# Patient Record
Sex: Male | Born: 1985
Health system: Southern US, Community
[De-identification: ages and names within clinical notes are randomized; demographics above are authoritative.]

## PROBLEM LIST (undated history)

## (undated) DIAGNOSIS — J45909 Unspecified asthma, uncomplicated: Secondary | ICD-10-CM

## (undated) DIAGNOSIS — I10 Essential (primary) hypertension: Secondary | ICD-10-CM

---

## 2017-11-18 ENCOUNTER — Emergency Department (HOSPITAL_COMMUNITY): Payer: Self-pay

## 2017-11-18 ENCOUNTER — Encounter (HOSPITAL_COMMUNITY): Payer: Self-pay | Admitting: Emergency Medicine

## 2017-11-18 ENCOUNTER — Inpatient Hospital Stay (HOSPITAL_COMMUNITY)
Admission: EM | Admit: 2017-11-18 | Discharge: 2017-11-22 | DRG: 871 | Disposition: A | Discharge status: Home / Self Care | Payer: Self-pay | Attending: Family Medicine | Admitting: Family Medicine

## 2017-11-18 DIAGNOSIS — R0902 Hypoxemia: Secondary | ICD-10-CM

## 2017-11-18 DIAGNOSIS — E8809 Other disorders of plasma-protein metabolism, not elsewhere classified: Secondary | ICD-10-CM | POA: Diagnosis present

## 2017-11-18 DIAGNOSIS — I10 Essential (primary) hypertension: Secondary | ICD-10-CM

## 2017-11-18 DIAGNOSIS — R6520 Severe sepsis without septic shock: Secondary | ICD-10-CM

## 2017-11-18 DIAGNOSIS — J129 Viral pneumonia, unspecified: Secondary | ICD-10-CM | POA: Diagnosis present

## 2017-11-18 DIAGNOSIS — J4520 Mild intermittent asthma, uncomplicated: Secondary | ICD-10-CM

## 2017-11-18 DIAGNOSIS — R0600 Dyspnea, unspecified: Secondary | ICD-10-CM

## 2017-11-18 DIAGNOSIS — F17210 Nicotine dependence, cigarettes, uncomplicated: Secondary | ICD-10-CM | POA: Diagnosis present

## 2017-11-18 DIAGNOSIS — R042 Hemoptysis: Secondary | ICD-10-CM | POA: Diagnosis present

## 2017-11-18 DIAGNOSIS — R059 Cough, unspecified: Secondary | ICD-10-CM

## 2017-11-18 DIAGNOSIS — E860 Dehydration: Secondary | ICD-10-CM | POA: Diagnosis present

## 2017-11-18 DIAGNOSIS — N179 Acute kidney failure, unspecified: Secondary | ICD-10-CM | POA: Diagnosis present

## 2017-11-18 DIAGNOSIS — Z23 Encounter for immunization: Secondary | ICD-10-CM

## 2017-11-18 DIAGNOSIS — R05 Cough: Secondary | ICD-10-CM

## 2017-11-18 DIAGNOSIS — J189 Pneumonia, unspecified organism: Secondary | ICD-10-CM | POA: Diagnosis present

## 2017-11-18 DIAGNOSIS — R509 Fever, unspecified: Secondary | ICD-10-CM

## 2017-11-18 DIAGNOSIS — R809 Proteinuria, unspecified: Secondary | ICD-10-CM

## 2017-11-18 DIAGNOSIS — J45909 Unspecified asthma, uncomplicated: Secondary | ICD-10-CM | POA: Diagnosis present

## 2017-11-18 DIAGNOSIS — I161 Hypertensive emergency: Secondary | ICD-10-CM | POA: Diagnosis present

## 2017-11-18 DIAGNOSIS — A419 Sepsis, unspecified organism: Principal | ICD-10-CM | POA: Diagnosis present

## 2017-11-18 HISTORY — DX: Essential (primary) hypertension: I10

## 2017-11-18 HISTORY — DX: Unspecified asthma, uncomplicated: J45.909

## 2017-11-18 LAB — URINALYSIS, MICROSCOPIC (REFLEX)
Bacteria, UA: NONE SEEN
WBC, UA: NONE SEEN WBC/hpf (ref 0–5)

## 2017-11-18 LAB — URINALYSIS, ROUTINE W REFLEX MICROSCOPIC
BILIRUBIN URINE: NEGATIVE
Glucose, UA: NEGATIVE mg/dL
Leukocytes, UA: NEGATIVE
Nitrite: NEGATIVE
Specific Gravity, Urine: 1.015 (ref 1.005–1.030)
pH: 6.5 (ref 5.0–8.0)

## 2017-11-18 LAB — CBC WITH DIFFERENTIAL/PLATELET
Abs Immature Granulocytes: 0.1 10*3/uL — ABNORMAL HIGH (ref 0.00–0.07)
Basophils Absolute: 0.1 10*3/uL (ref 0.0–0.1)
Basophils Relative: 0 %
EOS ABS: 0 10*3/uL (ref 0.0–0.5)
EOS PCT: 0 %
HCT: 48.2 % (ref 39.0–52.0)
Hemoglobin: 15.2 g/dL (ref 13.0–17.0)
Immature Granulocytes: 1 %
Lymphocytes Relative: 6 %
Lymphs Abs: 0.9 10*3/uL (ref 0.7–4.0)
MCH: 26.9 pg (ref 26.0–34.0)
MCHC: 31.5 g/dL (ref 30.0–36.0)
MCV: 85.3 fL (ref 80.0–100.0)
Monocytes Absolute: 1.1 10*3/uL — ABNORMAL HIGH (ref 0.1–1.0)
Monocytes Relative: 7 %
NEUTROS PCT: 86 %
Neutro Abs: 13.4 10*3/uL — ABNORMAL HIGH (ref 1.7–7.7)
PLATELETS: 259 10*3/uL (ref 150–400)
RBC: 5.65 MIL/uL (ref 4.22–5.81)
RDW: 13.2 % (ref 11.5–15.5)
WBC: 15.6 10*3/uL — AB (ref 4.0–10.5)
nRBC: 0 % (ref 0.0–0.2)

## 2017-11-18 LAB — PROTIME-INR
INR: 1.06
PROTHROMBIN TIME: 13.7 s (ref 11.4–15.2)

## 2017-11-18 LAB — COMPREHENSIVE METABOLIC PANEL
ALBUMIN: 3.2 g/dL — AB (ref 3.5–5.0)
ALK PHOS: 94 U/L (ref 38–126)
ALT: 31 U/L (ref 0–44)
ANION GAP: 10 (ref 5–15)
AST: 26 U/L (ref 15–41)
BILIRUBIN TOTAL: 0.9 mg/dL (ref 0.3–1.2)
BUN: 13 mg/dL (ref 6–20)
CALCIUM: 8.9 mg/dL (ref 8.9–10.3)
CO2: 22 mmol/L (ref 22–32)
Chloride: 99 mmol/L (ref 98–111)
Creatinine, Ser: 1.44 mg/dL — ABNORMAL HIGH (ref 0.61–1.24)
GFR calc non Af Amer: >60 mL/min (ref >60)
Glucose, Bld: 132 mg/dL — ABNORMAL HIGH (ref 70–99)
POTASSIUM: 4.5 mmol/L (ref 3.5–5.1)
SODIUM: 131 mmol/L — AB (ref 135–145)
TOTAL PROTEIN: 7.6 g/dL (ref 6.5–8.1)

## 2017-11-18 LAB — I-STAT CG4 LACTIC ACID, ED: Lactic Acid, Venous: 1.61 mmol/L (ref 0.5–1.9)

## 2017-11-18 LAB — GROUP A STREP BY PCR: Group A Strep by PCR: NOT DETECTED

## 2017-11-18 LAB — INFLUENZA PANEL BY PCR (TYPE A & B)
Influenza A By PCR: NEGATIVE
Influenza B By PCR: NEGATIVE

## 2017-11-18 MED ORDER — ACETAMINOPHEN 500 MG PO TABS
1000.0000 mg | ORAL_TABLET | Freq: Once | ORAL | Status: AC
  Administered 2017-11-18: 1000 mg via ORAL
  Filled 2017-11-18: qty 2

## 2017-11-18 MED ORDER — ACETAMINOPHEN 650 MG RE SUPP: 650.0000 mg | Freq: Four times a day (QID) | RECTAL | Status: DC | PRN

## 2017-11-18 MED ORDER — SODIUM CHLORIDE 0.9 % IV BOLUS
1000.0000 mL | Freq: Once | INTRAVENOUS | Status: AC
  Administered 2017-11-18: 1000 mL via INTRAVENOUS

## 2017-11-18 MED ORDER — HYDRALAZINE HCL 20 MG/ML IJ SOLN
2.0000 mg | Freq: Four times a day (QID) | INTRAMUSCULAR | Status: DC | PRN
  Administered 2017-11-18: 2 mg via INTRAVENOUS
  Filled 2017-11-18: qty 1

## 2017-11-18 MED ORDER — ONDANSETRON HCL 4 MG/2ML IJ SOLN
4.0000 mg | Freq: Once | INTRAMUSCULAR | Status: AC
  Administered 2017-11-18: 4 mg via INTRAVENOUS
  Filled 2017-11-18: qty 2

## 2017-11-18 MED ORDER — SODIUM CHLORIDE 0.9 % IV SOLN: INTRAVENOUS | Status: DC

## 2017-11-18 MED ORDER — VANCOMYCIN HCL IN DEXTROSE 1-5 GM/200ML-% IV SOLN
1000.0000 mg | Freq: Two times a day (BID) | INTRAVENOUS | Status: DC
  Administered 2017-11-19 – 2017-11-20 (×3): 1000 mg via INTRAVENOUS
  Filled 2017-11-18 (×4): qty 200

## 2017-11-18 MED ORDER — ALBUTEROL SULFATE (2.5 MG/3ML) 0.083% IN NEBU: 2.5000 mg | INHALATION_SOLUTION | RESPIRATORY_TRACT | Status: DC | PRN

## 2017-11-18 MED ORDER — SODIUM CHLORIDE 0.9 % IV SOLN
2.0000 g | Freq: Once | INTRAVENOUS | Status: AC
  Administered 2017-11-18: 2 g via INTRAVENOUS
  Filled 2017-11-18: qty 2

## 2017-11-18 MED ORDER — INFLUENZA VAC SPLIT QUAD 0.5 ML IM SUSY
0.5000 mL | PREFILLED_SYRINGE | INTRAMUSCULAR | Status: AC
  Administered 2017-11-22: 0.5 mL via INTRAMUSCULAR
  Filled 2017-11-18: qty 0.5

## 2017-11-18 MED ORDER — METRONIDAZOLE IN NACL 5-0.79 MG/ML-% IV SOLN
500.0000 mg | Freq: Three times a day (TID) | INTRAVENOUS | Status: DC
  Administered 2017-11-18 – 2017-11-19 (×2): 500 mg via INTRAVENOUS
  Filled 2017-11-18 (×2): qty 100

## 2017-11-18 MED ORDER — METOCLOPRAMIDE HCL 5 MG/ML IJ SOLN
10.0000 mg | Freq: Once | INTRAMUSCULAR | Status: AC
  Administered 2017-11-18: 10 mg via INTRAVENOUS
  Filled 2017-11-18: qty 2

## 2017-11-18 MED ORDER — ACETAMINOPHEN 325 MG PO TABS
650.0000 mg | ORAL_TABLET | Freq: Four times a day (QID) | ORAL | Status: DC | PRN
  Administered 2017-11-18 – 2017-11-19 (×2): 650 mg via ORAL
  Filled 2017-11-18 (×2): qty 2

## 2017-11-18 MED ORDER — IBUPROFEN 400 MG PO TABS
400.0000 mg | ORAL_TABLET | Freq: Once | ORAL | Status: AC
  Administered 2017-11-18: 400 mg via ORAL
  Filled 2017-11-18: qty 1

## 2017-11-18 MED ORDER — ACETAMINOPHEN 325 MG PO TABS
650.0000 mg | ORAL_TABLET | Freq: Once | ORAL | Status: AC
  Administered 2017-11-18: 650 mg via ORAL
  Filled 2017-11-18: qty 2

## 2017-11-18 MED ORDER — SODIUM CHLORIDE 0.9 % IV SOLN
2.0000 g | Freq: Two times a day (BID) | INTRAVENOUS | Status: DC
  Administered 2017-11-19 – 2017-11-20 (×3): 2 g via INTRAVENOUS
  Filled 2017-11-18 (×4): qty 2

## 2017-11-18 MED ORDER — VANCOMYCIN HCL IN DEXTROSE 1-5 GM/200ML-% IV SOLN: 1000.0000 mg | Freq: Once | INTRAVENOUS | Status: DC

## 2017-11-18 MED ORDER — ENOXAPARIN SODIUM 40 MG/0.4ML ~~LOC~~ SOLN
40.0000 mg | SUBCUTANEOUS | Status: DC
  Administered 2017-11-18 – 2017-11-20 (×3): 40 mg via SUBCUTANEOUS
  Filled 2017-11-18 (×3): qty 0.4

## 2017-11-18 MED ORDER — KETOROLAC TROMETHAMINE 30 MG/ML IJ SOLN
30.0000 mg | Freq: Once | INTRAMUSCULAR | Status: AC
  Administered 2017-11-18: 30 mg via INTRAVENOUS
  Filled 2017-11-18: qty 1

## 2017-11-18 MED ORDER — VANCOMYCIN HCL 10 G IV SOLR
2500.0000 mg | Freq: Once | INTRAVENOUS | Status: AC
  Administered 2017-11-18: 2500 mg via INTRAVENOUS
  Filled 2017-11-18: qty 2500

## 2017-11-18 NOTE — ED Provider Notes (Signed)
[facility] EMERGENCY DEPARTMENT Provider Note   CSN: [id number] Arrival date & time: 11/18/17  1330     History   Chief Complaint Chief Complaint  Patient presents with  . Fever  . Emesis  . Nasal Congestion  . Cough    HPI Anthonyjames Bargar is a 32 y.o. male with history of untreated hypertension, asthma who presents with a 3-day history of fever, cough, nasal congestion, nausea, vomiting and diarrhea.  He has also had some sore throat.  Patient reports his symptoms began fairly suddenly.  He denies any known sick contacts.  He denies any chest pain, shortness of breath, abdominal pain.  He reports his urine has been darker, but denies any other urinary symptoms.  He has taken ibuprofen at home for symptoms.  He reports he is supposed to take something for his blood pressure, however he is unsure what the name of it is.  HPI  Past Medical History:  Diagnosis Date  . Asthma   . Hypertension     Patient Active Problem List   Diagnosis Date Noted  . Sepsis (HCC) 11/18/2017    History reviewed. No pertinent surgical history.      Home Medications    Prior to Admission medications   Medication Sig Start Date End Date Taking? Authorizing Provider  acetaminophen (TYLENOL) 325 MG tablet Take 650 mg by mouth every 6 (six) hours as needed for mild pain.   Yes [provider]  aspirin-acetaminophen-caffeine (EXCEDRIN MIGRAINE) [phone] MG tablet Take 1 tablet by mouth every 6 (six) hours as needed for headache.   Yes [provider]  ibuprofen (ADVIL,MOTRIN) 200 MG tablet Take 200 mg by mouth every 6 (six) hours as needed for mild pain.   Yes [provider]  Phenylephrine-DM-GG-APAP (TYLENOL COLD/FLU SEVERE) 5-10-200-325 MG TABS Take 1 tablet by mouth 4 (four) times daily as needed (pain).   Yes [provider]    Family History History reviewed. No pertinent family history.  Social History Social History   Tobacco  Use  . Smoking status: Current Every Day Smoker    Packs/day: 0.50    Types: Cigarettes  . Smokeless tobacco: Never Used  Substance Use Topics  . Alcohol use: Yes    Comment: socially  . Drug use: Never     Allergies   Patient has no allergy information on record.   Review of Systems Review of Systems  Constitutional: Positive for appetite change, chills and fever.  HENT: Positive for congestion and sore throat. Negative for facial swelling.   Respiratory: Positive for cough. Negative for shortness of breath.   Cardiovascular: Negative for chest pain.  Gastrointestinal: Positive for diarrhea, nausea and vomiting. Negative for abdominal pain and blood in stool.  Genitourinary: Negative for dysuria.  Musculoskeletal: Negative for back pain.  Skin: Negative for rash and wound.  Neurological: Negative for headaches.  Psychiatric/Behavioral: The patient is not nervous/anxious.      Physical Exam Updated Vital Signs BP (!) 153/98   Pulse (!) 102   Temp (!) 101.7 F (38.7 C) (Oral)   Resp (!) 30   Ht 5\' 9"  (1.753 m)   Wt 127 kg   SpO2 91%   BMI 41.35 kg/m   Physical Exam  Constitutional: He appears well-developed and well-nourished. No distress.  HENT:  Head: Normocephalic and atraumatic.  Mouth/Throat: Mucous membranes are dry. Oropharyngeal exudate, posterior oropharyngeal edema and posterior oropharyngeal erythema present. No tonsillar abscesses. Tonsils are 2+ on the right.  Tonsils are 2+ on the left.  Eyes: Pupils are equal, round, and reactive to light. Conjunctivae are normal. Right eye exhibits no discharge. Left eye exhibits no discharge. No scleral icterus.  Neck: Normal range of motion and full passive range of motion without pain. Neck supple. No spinous process tenderness and no muscular tenderness present. No neck rigidity. Normal range of motion present. No thyromegaly present.  Cardiovascular: Regular rhythm, normal heart sounds and intact distal pulses.  Tachycardia present. Exam reveals no gallop and no friction rub.  No murmur heard. Pulmonary/Chest: Effort normal and breath sounds normal. No stridor. No respiratory distress. He has no wheezes. He has no rales.  Abdominal: Soft. Bowel sounds are normal. He exhibits no distension. There is no tenderness. There is no rebound and no guarding.  Musculoskeletal: He exhibits no edema.  Lymphadenopathy:    He has no cervical adenopathy.  Neurological: He is alert. Coordination normal.  Skin: Skin is warm and dry. No rash noted. He is not diaphoretic. No pallor.  Psychiatric: He has a normal mood and affect.  Nursing note and vitals reviewed.    ED Treatments / Results  Labs (all labs ordered are listed, but only abnormal results are displayed) Labs Reviewed  COMPREHENSIVE METABOLIC PANEL - Abnormal; Notable for the following components:      Result Value   Sodium 131 (*)    Glucose, Bld 132 (*)    Creatinine, Ser 1.44 (*)    Albumin 3.2 (*)    All other components within normal limits  CBC WITH DIFFERENTIAL/PLATELET - Abnormal; Notable for the following components:   WBC 15.6 (*)    Neutro Abs 13.4 (*)    Monocytes Absolute 1.1 (*)    Abs Immature Granulocytes 0.10 (*)    All other components within normal limits  URINALYSIS, ROUTINE W REFLEX MICROSCOPIC - Abnormal; Notable for the following components:   Color, Urine YELLOW (*)    APPearance CLEAR (*)    Hgb urine dipstick LARGE (*)    Ketones, ur TRACE (*)    Protein, ur >300 (*)    All other components within normal limits  GROUP A STREP BY PCR  CULTURE, BLOOD (ROUTINE X 2)  CULTURE, BLOOD (ROUTINE X 2)  URINE CULTURE  PROTIME-INR  INFLUENZA PANEL BY PCR (TYPE A & B)  URINALYSIS, MICROSCOPIC (REFLEX)  I-STAT CG4 LACTIC ACID, ED    EKG None  Radiology Dg Chest 2 View  Result Date: 11/18/2017 CLINICAL DATA:  Fever, productive cough.  Shortness of breath. EXAM: CHEST - 2 VIEW COMPARISON:  Radiographs of January 27, 2014. FINDINGS: The heart size and mediastinal contours are within normal limits. Both lungs are clear. No pneumothorax or pleural effusion is noted. The visualized skeletal structures are unremarkable. IMPRESSION: No active cardiopulmonary disease. Electronically Signed   By: Lupita Raider, M.D.   On: 11/18/2017 15:11    Procedures Procedures (including critical care time)  Medications Ordered in ED Medications  ceFEPIme (MAXIPIME) 2 g in sodium chloride 0.9 % 100 mL IVPB (has no administration in time range)  metroNIDAZOLE (FLAGYL) IVPB 500 mg (has no administration in time range)  vancomycin (VANCOCIN) 2,500 mg in sodium chloride 0.9 % 500 mL IVPB (has no administration in time range)  ceFEPIme (MAXIPIME) 2 g in sodium chloride 0.9 % 100 mL IVPB (has no administration in time range)  vancomycin (VANCOCIN) IVPB 1000 mg/200 mL premix (has no administration in time range)  acetaminophen (TYLENOL) tablet 1,000 mg (1,000  mg Oral Given 11/18/17 1409)  sodium chloride 0.9 % bolus 1,000 mL (0 mLs Intravenous Stopped 11/18/17 1515)  ondansetron (ZOFRAN) injection 4 mg (4 mg Intravenous Given 11/18/17 1410)  ketorolac (TORADOL) 30 MG/ML injection 30 mg (30 mg Intravenous Given 11/18/17 1509)  sodium chloride 0.9 % bolus 1,000 mL (0 mLs Intravenous Stopped 11/18/17 1832)  metoCLOPramide (REGLAN) injection 10 mg (10 mg Intravenous Given 11/18/17 1719)  acetaminophen (TYLENOL) tablet 650 mg (650 mg Oral Given 11/18/17 1803)  sodium chloride 0.9 % bolus 1,000 mL (1,000 mLs Intravenous New Bag/Given 11/18/17 1842)  ibuprofen (ADVIL,MOTRIN) tablet 400 mg (400 mg Oral Given 11/18/17 1902)     Initial Impression / Assessment and Plan / ED Course  I have reviewed the triage vital signs and the nursing notes.  Pertinent labs & imaging results that were available during my care of the patient were reviewed by me and considered in my medical decision making (see chart for details).  Clinical Course as of Nov 19 1950  Sat Nov 18, 2017  3727 32 year old male complaining of fever headache nasal congestion vomiting.  He is tachycardic and hypertensive here.  We have sent off a sepsis panel and flu testing and giving him IV fluids and pain control.   [MB]    Clinical Course User Index [MB] Terrilee Files, MD    Patient presenting with probable viral syndrome, however persistent tachycardia and fever after fluids and antipyretics. Low suspicion for meningitis as patient's headache improved with Toradol in the ED and patient has FROM of neck without pain and no neck pain. Patient with leukocytosis of 15.6.  CMP shows sodium 131, creatinine 1.44.  No labs in the past to compare.  Lactic is 1.61.  Patient found to have protein and large hematuria in the urine. Urine was very dark on my visualization.  Sepsis panel ordered up front including blood cultures, however code sepsis not initiated considering an initial plan for discharge home with viral syndrome such as influenza.  However after failed treatment with fluids and antipyretics in the ED with negative influenza panel and AKI, patient will be admitted for further management.  Antibiotics for unknown source initiated in the ED.  I spoke with the family medicine teaching service who will admit the patient for further management.  I appreciate their assistance with the patient.  Patient also evaluated by my attending physicians, Dr. Charm Barges and Dr. Rosalia Hammers, who guided the patient's management and agrees with plan.  Final Clinical Impressions(s) / ED Diagnoses   Final diagnoses:  Fever of unknown origin    ED Discharge Orders    None       Verdis Prime 11/18/17 2004    Terrilee Files, MD 11/19/17 [phone]

## 2017-11-18 NOTE — ED Notes (Addendum)
PA Law aware of pulse jumping around from the 80's-140's.  Sinus tach rhythm. Will continue to monitor.
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
## 2017-11-18 NOTE — Plan of Care (Signed)
Patient was oriented to the unit. Patient was updated about current care plan of using IV antibiotics and he displayed some understanding and reported he had no further questions. Will continue to monitor and assess patient.

## 2017-11-18 NOTE — Progress Notes (Signed)
Pharmacy Antibiotic Note  Anthony Dalton is a 32 y.o. male admitted on 11/18/2017 with sepsis.  Pharmacy has been consulted for vancomycin and cefepime dosing. Tmax is 13.2 and WBC is elevated at 15.6. SCr is mildly elevated at 1.44.  Plan: Vancomycin 2500mg  IV x 1 then 1gm IV Q12H Cefepime 2gm IV Q12H F/u renal fxn, C&S, clinical status and trough at SS  Height: 5\' 9"  (175.3 cm) Weight: 280 lb (127 kg) IBW/kg (Calculated) : 70.7  Temp (24hrs), Avg:102.5 F (39.2 C), Min:101.7 F (38.7 C), Max:103.2 F (39.6 C)  Recent Labs  Lab 11/18/17 1350 11/18/17 1405  WBC 15.6*  --   CREATININE 1.44*  --   LATICACIDVEN  --  1.61    Estimated Creatinine Clearance: 96.2 mL/min (A) (by C-G formula based on SCr of 1.44 mg/dL (H)).    Not on File  Antimicrobials this admission: Vanc 11/9>> Cefepime 11/9>>  Dose adjustments this admission: N/A  Microbiology results: Pending  Thank you for allowing pharmacy to be a part of this patient's care.  Evalyn Shultis, Drake Leach 11/18/2017 7:31 PM

## 2017-11-18 NOTE — ED Triage Notes (Signed)
Pt here for eval of congestion, cough, emesis with some blood in vomit. States he had a temp of 103 at home. Tachycardic in 130s at check in.

## 2017-11-19 ENCOUNTER — Other Ambulatory Visit: Payer: Self-pay

## 2017-11-19 ENCOUNTER — Inpatient Hospital Stay (HOSPITAL_COMMUNITY): Payer: Self-pay

## 2017-11-19 DIAGNOSIS — N179 Acute kidney failure, unspecified: Secondary | ICD-10-CM | POA: Diagnosis present

## 2017-11-19 LAB — RESPIRATORY PANEL BY PCR
Adenovirus: NOT DETECTED
Bordetella pertussis: NOT DETECTED
CORONAVIRUS 229E-RVPPCR: NOT DETECTED
CORONAVIRUS HKU1-RVPPCR: NOT DETECTED
CORONAVIRUS OC43-RVPPCR: NOT DETECTED
Chlamydophila pneumoniae: NOT DETECTED
Coronavirus NL63: NOT DETECTED
Influenza A: NOT DETECTED
Influenza B: NOT DETECTED
METAPNEUMOVIRUS-RVPPCR: NOT DETECTED
Mycoplasma pneumoniae: NOT DETECTED
PARAINFLUENZA VIRUS 1-RVPPCR: NOT DETECTED
Parainfluenza Virus 2: NOT DETECTED
Parainfluenza Virus 3: NOT DETECTED
Parainfluenza Virus 4: NOT DETECTED
Respiratory Syncytial Virus: NOT DETECTED
Rhinovirus / Enterovirus: NOT DETECTED

## 2017-11-19 LAB — CBC
HEMATOCRIT: 44.4 % (ref 39.0–52.0)
Hemoglobin: 14.5 g/dL (ref 13.0–17.0)
MCH: 27.6 pg (ref 26.0–34.0)
MCHC: 32.7 g/dL (ref 30.0–36.0)
MCV: 84.4 fL (ref 80.0–100.0)
NRBC: 0 % (ref 0.0–0.2)
PLATELETS: 209 10*3/uL (ref 150–400)
RBC: 5.26 MIL/uL (ref 4.22–5.81)
RDW: 13.4 % (ref 11.5–15.5)
WBC: 16 10*3/uL — ABNORMAL HIGH (ref 4.0–10.5)

## 2017-11-19 LAB — BASIC METABOLIC PANEL
Anion gap: 9 (ref 5–15)
BUN: 11 mg/dL (ref 6–20)
CHLORIDE: 105 mmol/L (ref 98–111)
CO2: 22 mmol/L (ref 22–32)
CREATININE: 1.46 mg/dL — AB (ref 0.61–1.24)
Calcium: 8.3 mg/dL — ABNORMAL LOW (ref 8.9–10.3)
GFR calc Af Amer: >60 mL/min (ref >60)
GFR calc non Af Amer: >60 mL/min (ref >60)
Glucose, Bld: 124 mg/dL — ABNORMAL HIGH (ref 70–99)
Potassium: 4.3 mmol/L (ref 3.5–5.1)
Sodium: 136 mmol/L (ref 135–145)

## 2017-11-19 LAB — PROTEIN / CREATININE RATIO, URINE
CREATININE, URINE: 160.05 mg/dL
Total Protein, Urine: 571 mg/dL

## 2017-11-19 LAB — HIV ANTIBODY (ROUTINE TESTING W REFLEX): HIV Screen 4th Generation wRfx: NONREACTIVE

## 2017-11-19 LAB — STREP PNEUMONIAE URINARY ANTIGEN: STREP PNEUMO URINARY ANTIGEN: NEGATIVE

## 2017-11-19 MED ORDER — AMLODIPINE BESYLATE 10 MG PO TABS
10.0000 mg | ORAL_TABLET | Freq: Every day | ORAL | Status: DC
  Administered 2017-11-19 – 2017-11-22 (×4): 10 mg via ORAL
  Filled 2017-11-19 (×4): qty 1

## 2017-11-19 MED ORDER — IPRATROPIUM-ALBUTEROL 0.5-2.5 (3) MG/3ML IN SOLN
3.0000 mL | Freq: Four times a day (QID) | RESPIRATORY_TRACT | Status: DC
  Administered 2017-11-19 (×2): 3 mL via RESPIRATORY_TRACT
  Filled 2017-11-19 (×2): qty 3

## 2017-11-19 MED ORDER — ACETAMINOPHEN 325 MG PO TABS
650.0000 mg | ORAL_TABLET | ORAL | Status: DC | PRN
  Administered 2017-11-19 – 2017-11-22 (×16): 650 mg via ORAL
  Filled 2017-11-19 (×15): qty 2

## 2017-11-19 MED ORDER — ACETAMINOPHEN 650 MG RE SUPP: 650.0000 mg | Freq: Four times a day (QID) | RECTAL | Status: DC | PRN

## 2017-11-19 MED ORDER — SODIUM CHLORIDE 0.9 % IV SOLN
500.0000 mg | INTRAVENOUS | Status: DC
  Filled 2017-11-19: qty 500

## 2017-11-19 MED ORDER — IPRATROPIUM-ALBUTEROL 0.5-2.5 (3) MG/3ML IN SOLN
3.0000 mL | Freq: Three times a day (TID) | RESPIRATORY_TRACT | Status: DC
  Administered 2017-11-20 (×3): 3 mL via RESPIRATORY_TRACT
  Filled 2017-11-19 (×3): qty 3

## 2017-11-19 MED ORDER — SODIUM CHLORIDE 0.9 % IV SOLN
100.0000 mg | Freq: Two times a day (BID) | INTRAVENOUS | Status: DC
  Administered 2017-11-19 – 2017-11-20 (×2): 100 mg via INTRAVENOUS
  Filled 2017-11-19 (×3): qty 100

## 2017-11-19 MED ORDER — OXYCODONE HCL 5 MG PO TABS
5.0000 mg | ORAL_TABLET | Freq: Four times a day (QID) | ORAL | Status: AC | PRN
  Administered 2017-11-19 – 2017-11-20 (×4): 5 mg via ORAL
  Filled 2017-11-19 (×4): qty 1

## 2017-11-19 MED ORDER — HYDRALAZINE HCL 20 MG/ML IJ SOLN: 2.0000 mg | INTRAMUSCULAR | Status: DC | PRN

## 2017-11-19 MED ORDER — HYDRALAZINE HCL 20 MG/ML IJ SOLN
5.0000 mg | INTRAMUSCULAR | Status: DC | PRN
  Administered 2017-11-19 – 2017-11-21 (×9): 5 mg via INTRAVENOUS
  Filled 2017-11-19 (×8): qty 1

## 2017-11-19 NOTE — Progress Notes (Addendum)
While on floor (1100)  RN asked for assistance with cooling blanket. Pt noted to have fever of 105.1 per cooling blanket. Melinda RN assisted with bedside LP and packed pt with ice packs to bilateral arm pits, groin and neck.  1300 f/u on pt, temp remains at 104.9. MD made aware, no new orders at this time.

## 2017-11-19 NOTE — Progress Notes (Signed)
Family Medicine Teaching Service Daily Progress Note Intern Pager: [phone]  Patient name: Anthony Dalton Medical record number: [id number] Date of birth: Jul 20, 1985 Age: 32 y.o. Gender: male  Primary Care Provider: Patient, No Pcp Per Consultants: none Code Status: FULL  Pt Overview and Major Events to Date:  11/9 Patient admitted  Assessment and Plan:  Anthony Dalton is a 32 y.o. male presenting with fever and headache for 3 days. PMH is significant for HTN, asthma.   Sepsis of unknown origin: Symptoms of fever, headache, diarrhea, vomiting, and cough with some hemoptysis with fever up to 103.6 on 11/10 despite several doses of antipyretics overnight.  Workup including influenza and strep testing, UA, CBC, BMP, CXR has been inconclusive, with remarkable results including a WBC of 16 with an absolute neutrophil count of 13.4 and Creatinine of 1.46 as well as >300 protein on UA.  Although UA has large hemoglobin, RBC count on microscopy is 0-5, arguing against a nephritic process.  Diagnosis is either viral or possibly S pneumoniae infection given patient's high fevers after antibiotic administration and rust colored sputum.   RVP is negative. - continue cefepime and vancomycin, d/c metronidazole - f/u blood/urine/sputum cultures, HIV screen, strep pneumonia and legionella antigens - monitor fever curve - droplet precautions - NS @ 121mL/hr - strict I/O - consult ID on 11/11 - LP was attempted at bedside but was unsuccessful; will consult IR for second attempt 11/11  AKI  Proteinuria: Cr 1.44 on admission with unclear baseline and remained elevated to 1.46 after 3L fluid boluses and 1.5 x maintenance fluid rate. UA with protein >300 on admission.  Urine microscopy with only 0-5 RBCs, making nephritic process unlikely.  Nephrotic process less likely since albumin is >3.  Proteinuria can often occur with fever in young adults and children.  Ordered urine protein/creatinine ratio. -Trend BMP  / urinary output - F/u protein/creatinine ratio   - Replace electrolytes as indicated - Avoid nephrotoxic agents, ensure adequate renal perfusion  Hypoalbuminemia: Albumin 3.2 on admission with no previous to compare.  BUN can be decreased in the setting of an acute infection.  Given findings on urine with obtain protein/creatinin e ratio and consider 24 hour urine protein to determine if patient may have nephropathy and is spilling albumin.  - Urine protein/creatinine pending  HTN: BP 206/135 on arrival meeting hypertensive emergency criteria with headache and AKI.  Has remained elevated overnight and is 186/113 on 11/10. - Monitor  - Prn hydralazine for sys >180, sys >110.  - Add amlodipine 10 mg daily  Asthma: Has albuterol inhaler at home. Last used a couple months ago.   Alcohol Use: Patient reports drinking socially 1 beer and 2 shots, 1-2 x per week only. Doubt patient is alcoholic but given low sodium and low albumin on admission and reported history, will monitor for any signs of alcohol withdrawal. Can likely d/c if patient remains stable - CIWA with no benzo to monitor for any signs of alcohol withdrawal   FEN/GI: Lovenox PPx: Heart healthy due to high BP  Disposition: continue on telemetry  Subjective:  Patient says he has mild body aches and feels very tired but has no other complaints.  Patient's fiance feels that patient would benefit from an ID consult.  Objective: Temp:  [101.3 F (38.5 C)-103.6 F (39.8 C)] 103.6 F (39.8 C) (11/10 0401) Pulse Rate:  [82-132] 107 (11/10 0107) Resp:  [15-35] 22 (11/10 0107) BP: (138-208)/(72-161) 186/113 (11/10 0401) SpO2:  [91 %-98 %] 98 % (  11/10 0107) Weight:  [126.3 kg-127 kg] 126.3 kg (11/09 2100) Physical Exam: General: tired appearing man lying in bed with ice packs in place Cardiovascular: RRR, no MRG, good perfusion Respiratory: CTAB, no increased work of breathing, frequent cough with rust colored  sputum Abdomen: nontender, nondistended Extremities: no edema, good perfusion Neurologic: some confusion and lethargy that is waxing and waning  Laboratory: Recent Labs  Lab 11/18/17 1350 11/19/17 0313  WBC 15.6* 16.0*  HGB 15.2 14.5  HCT 48.2 44.4  PLT 259 209   Recent Labs  Lab 11/18/17 1350 11/19/17 0313  NA 131* 136  K 4.5 4.3  CL 99 105  CO2 22 22  BUN 13 11  CREATININE 1.44* 1.46*  CALCIUM 8.9 8.3*  PROT 7.6  --   BILITOT 0.9  --   ALKPHOS 94  --   ALT 31  --   AST 26  --   GLUCOSE 132* 124*    Imaging/Diagnostic Tests: Dg Chest 2 View  Result Date: 11/18/2017 CLINICAL DATA:  Fever, productive cough.  Shortness of breath. EXAM: CHEST - 2 VIEW COMPARISON:  Radiographs of January 27, 2014. FINDINGS: The heart size and mediastinal contours are within normal limits. Both lungs are clear. No pneumothorax or pleural effusion is noted. The visualized skeletal structures are unremarkable. IMPRESSION: No active cardiopulmonary disease. Electronically Signed   By: Lupita Raider, M.D.   On: 11/18/2017 15:11     Lennox Solders, MD 11/19/2017, 7:52 AM PGY-2,  Family Medicine FPTS Intern pager: [phone], text pages welcome

## 2017-11-19 NOTE — Progress Notes (Addendum)
Spoke with provider regarding patient's temperature and BP. Patient's BP was 206/124(146) with a temp of 103.1 oral. MD placed orders for PRN BP med. Pt was give PO Tylenol 650 mg and PRN Hydralazine 2 mg. MD is aware of high BP and was instructed to continue monitoring BP.     0110 Check patient's vitals again. Temp 103 Oral  And BP 195/116. Ice packs placed on patient (behind neck, under arms, and groin). Will continue to monitor patient's BP.

## 2017-11-20 DIAGNOSIS — R509 Fever, unspecified: Secondary | ICD-10-CM

## 2017-11-20 DIAGNOSIS — R059 Cough, unspecified: Secondary | ICD-10-CM

## 2017-11-20 DIAGNOSIS — R0602 Shortness of breath: Secondary | ICD-10-CM

## 2017-11-20 DIAGNOSIS — J189 Pneumonia, unspecified organism: Secondary | ICD-10-CM | POA: Diagnosis present

## 2017-11-20 DIAGNOSIS — R0600 Dyspnea, unspecified: Secondary | ICD-10-CM

## 2017-11-20 DIAGNOSIS — R05 Cough: Secondary | ICD-10-CM

## 2017-11-20 DIAGNOSIS — R0902 Hypoxemia: Secondary | ICD-10-CM

## 2017-11-20 LAB — LEGIONELLA PNEUMOPHILA SEROGP 1 UR AG: L. pneumophila Serogp 1 Ur Ag: NEGATIVE

## 2017-11-20 LAB — BASIC METABOLIC PANEL
Anion gap: 9 (ref 5–15)
BUN: 10 mg/dL (ref 6–20)
CALCIUM: 8.4 mg/dL — AB (ref 8.9–10.3)
CO2: 20 mmol/L — AB (ref 22–32)
CREATININE: 1.19 mg/dL (ref 0.61–1.24)
Chloride: 102 mmol/L (ref 98–111)
GFR calc non Af Amer: >60 mL/min (ref >60)
Glucose, Bld: 145 mg/dL — ABNORMAL HIGH (ref 70–99)
Potassium: 4 mmol/L (ref 3.5–5.1)
Sodium: 131 mmol/L — ABNORMAL LOW (ref 135–145)

## 2017-11-20 LAB — CBC
HEMATOCRIT: 45 % (ref 39.0–52.0)
Hemoglobin: 14.7 g/dL (ref 13.0–17.0)
MCH: 26.6 pg (ref 26.0–34.0)
MCHC: 32.7 g/dL (ref 30.0–36.0)
MCV: 81.5 fL (ref 80.0–100.0)
PLATELETS: 191 10*3/uL (ref 150–400)
RBC: 5.52 MIL/uL (ref 4.22–5.81)
RDW: 13.2 % (ref 11.5–15.5)
WBC: 15.2 10*3/uL — AB (ref 4.0–10.5)
nRBC: 0 % (ref 0.0–0.2)

## 2017-11-20 LAB — URINE CULTURE: Culture: NO GROWTH

## 2017-11-20 MED ORDER — SODIUM CHLORIDE 0.9 % IV SOLN
INTRAVENOUS | Status: AC
  Administered 2017-11-20 (×2): via INTRAVENOUS

## 2017-11-20 MED ORDER — DOXYCYCLINE HYCLATE 100 MG PO TABS
100.0000 mg | ORAL_TABLET | Freq: Two times a day (BID) | ORAL | Status: DC
  Administered 2017-11-20 – 2017-11-22 (×4): 100 mg via ORAL
  Filled 2017-11-20 (×5): qty 1

## 2017-11-20 NOTE — Discharge Summary (Addendum)
Family Medicine Teaching [facility] Discharge Summary  Patient name: Anthony Dalton Medical record number: [id number] Date of birth: 07-08-1985 Age: 32 y.o. Gender: male Date of Admission: 11/18/2017  Date of Discharge:11/22/2017 Admitting Physician: [place] Shirley, DO  Primary Care Provider: Patient, No Pcp Per Consultants: ID  Indication for Hospitalization: Febrile Illness with prostration  Discharge Diagnoses/Problem List:  Principle Diagnosis: Viral Pneumonia  Other Diagnoses HTN Acute Kidney InjuryI Asthma  Disposition: discharge home  Discharge Condition: improved, stable  Discharge Exam:  General: well-appearing, no acute distress- much improved from admission Heart: RRR, no murmur appreciated Pulm: mild bilateral rhonchi with coughing and mildly bloody sputum during exam   Brief Hospital Course:  Patient admitted 2/2 intractable fever of unknown origin. While inpatient he had temperature in 105 F range. Initial chest xray was clear of infectious signs but due to coughing and oxygen desaturations, ordered repeat xray showing possible pneumonia. ID was consulted when patient remained febrile on broad spectrum antibiotics and they recommended PO doxycycline. He completed his course of antibiotics on day of discharge and was afebrile for >24 hours with good PO intake. He clinically looked much better.  During the admission, he was also monitored for: High blood pressure- Discharged on amlodipine 10mg , and indapamide 2.5mg  daily AKI (resolved by discharge) and proteinuria Respiratory distress with intermittent oxygen requirements and duonebs with improvement- discharged with rescue albuterol inhaler  Issues for Follow Up:  1. Recommend workup for OSA 2. HTN monitoring and medication management 3. Recommend renal panel/urinalysis to follow up from AKI and proteinuria while ill inpatient  Significant Procedures: none  Significant Labs and Imaging:  Recent Labs  Lab  11/20/17 0808 11/21/17 0314 11/22/17 0748  WBC 15.2* 17.1* 15.4*  HGB 14.7 14.2 14.0  HCT 45.0 43.5 43.8  PLT 191 200 291  / Recent Labs  Lab 11/18/17 1350 11/19/17 0313 11/20/17 0808 11/21/17 0314 11/22/17 0748  NA 131* 136 131* 135 137  K 4.5 4.3 4.0 4.0 3.3*  CL 99 105 102 103 105  CO2 22 22 20* 23 25  GLUCOSE 132* 124* 145* 136* 131*  BUN 13 11 10 10 14   CREATININE 1.44* 1.46* 1.19 1.22 1.21  CALCIUM 8.9 8.3* 8.4* 8.6* 8.7*  ALKPHOS 94  --   --   --   --   AST 26  --   --   --   --   ALT 31  --   --   --   --   ALBUMIN 3.2*  --   --   --   --    Legionella pneumo- negative Strep pneumo- negative RVP- negative HIV- neg Urine culture neg Protein/creatinine ratio unable to calculate  Urinalysis    Component Value Date/Time   COLORURINE YELLOW (A) 11/18/2017 1715   APPEARANCEUR CLEAR (A) 11/18/2017 1715   LABSPEC 1.015 11/18/2017 1715   PHURINE 6.5 11/18/2017 1715   GLUCOSEU NEGATIVE 11/18/2017 1715   HGBUR LARGE (A) 11/18/2017 1715   BILIRUBINUR NEGATIVE 11/18/2017 1715   KETONESUR TRACE (A) 11/18/2017 1715   PROTEINUR >300 (A) 11/18/2017 1715   NITRITE NEGATIVE 11/18/2017 1715   LEUKOCYTESUR NEGATIVE 11/18/2017 1715    Results/Tests Pending at Time of Discharge: sputum culture  Discharge Medications:  Allergies as of 11/22/2017   Not on File     Medication List    STOP taking these medications   ibuprofen 200 MG tablet Commonly known as:  ADVIL,MOTRIN   TYLENOL COLD/FLU SEVERE 5-10-200-325 MG Tabs Generic drug:  Phenylephrine-DM-GG-APAP     TAKE these medications   acetaminophen 325 MG tablet Commonly known as:  TYLENOL Take 650 mg by mouth every 6 (six) hours as needed for mild pain.   albuterol 108 (90 Base) MCG/ACT inhaler Commonly known as:  PROVENTIL HFA;VENTOLIN HFA Inhale 2 puffs into the lungs every 6 (six) hours as needed for wheezing or shortness of breath.   amLODipine 10 MG tablet Commonly known as:  NORVASC Take 1 tablet  (10 mg total) by mouth daily. Start taking on:  11/23/2017   aspirin-acetaminophen-caffeine 250-250-65 MG tablet Commonly known as:  EXCEDRIN MIGRAINE Take 1 tablet by mouth every 6 (six) hours as needed for headache.   doxycycline 100 MG tablet Commonly known as:  VIBRA-TABS Take 1 tablet (100 mg total) by mouth every 12 (twelve) hours.   indapamide 2.5 MG tablet Commonly known as:  LOZOL Take 1 tablet (2.5 mg total) by mouth daily. Start taking on:  11/23/2017       Discharge Instructions: Please refer to Patient Instructions section of EMR for full details.  Patient was counseled important signs and symptoms that should prompt return to medical care, changes in medications, dietary instructions, activity restrictions, and follow up appointments.   Follow-Up Appointments: Follow-up Information    [facility] Follow up on 11/30/2017.   Why:  1:50 for hospital follow up apt. Contact information: [address], [address] [place] [address]         Leeroy Bock, DO 11/22/2017, 12:31 PM PGY-1, [facility]

## 2017-11-20 NOTE — Consult Note (Signed)
[facility]    Date of Admission:  11/18/2017   Total days of antibiotics 2        Day 1: Vanc, Cefepime, Flagyl        Day 2: Flagyl d/ced, Doxy started              Reason for Consult: Fever    Referring Physician: McDiarmid, Tawanna Cooler Primary Care Physician: No PCP on file  Active Problems:   Sepsis (HCC)   Fever of unknown origin   Acute kidney injury (HCC)   . amLODipine  10 mg Oral Daily  . enoxaparin (LOVENOX) injection  40 mg Subcutaneous Q24H  . Influenza vac split quadrivalent PF  0.5 mL Intramuscular Tomorrow-1000  . ipratropium-albuterol  3 mL Nebulization TID    Recommendations: 1. Fever of unknown origin - most likely viral bronchitis/sinusitis - Stop vanc, cefepime - Change IV doxy to PO doxy 100mg  BID for 2 more days - Supportive therapy - adequate hydration, tylenol, antihistamines  Assessment: [name] is a 32 yo M admitted for concerns for acute febrile illness with headaches and productive sputum. His recurrent high fevers despite abx therapy makes a viral illness more likely than bacterial despite his negative viral panel. His headache lacks any meningeal signs and appear to be a sinus headache in terms of description and presentation. Suggest supportive care while allowing viral course to pass. Will continue abx for 2 more days in case of bacterial sinusitis component as he did have leukocytosis with elevated neutrophil count. No further work up necessary.   HPI: Anthony Dalton is a 32 y.o. male w/ PMH of HTN and Asthma presenting with fever and headache for 3 day duration. He was in his usual state of health until 3 days prior to admission when he developed acute onset headache with fever and cough without obvious precipitating event. He describes the headache as front of his forehead 10/10 sharp pain with 'feeling of wanting to rip his hair out.' He also had significant yellow sputum with red tinge. He also experienced nasal  and chest congestion, nausea, and diarrhea. He denies any blurry vision, photosensitivity, numbness, tingling, weakness. He denies any chest pain, palpitations, dyspnea.  His family also had the common cold lately with nasal congestion and cough but that resolved on its own w/o fevers after couple days. He was found to have fluctuating temperature between 101~102 and eventually was brought to the ED for evaluation. He has not gotten a flu shot.  In the ED, he was found to be febrile with 103F with altered mental status and qsofa of 2. Started on empiric vanc, cefepime and flagyl. Continuing to have intermittent fevers with last fever at 102F at [facility]. Repeat Chest X-ray after fluids showed left lower lobe consolidation and doxycycline was started.  Review of Systems: Review of Systems  Constitutional: Positive for fever and malaise/fatigue. Negative for weight loss.  HENT: Negative for ear discharge and ear pain.   Eyes: Negative for blurred vision, photophobia, pain and discharge.  Respiratory: Positive for cough, hemoptysis and sputum production. Negative for shortness of breath and wheezing.   Cardiovascular: Negative for chest pain and palpitations.  Gastrointestinal: Negative for abdominal pain, constipation, diarrhea, nausea and vomiting.  Genitourinary: Negative for dysuria, frequency and urgency.  Musculoskeletal: Negative for joint pain.  Skin: Negative for rash.  Neurological: Positive for headaches. Negative for tingling, sensory change and focal weakness.    Past Medical History:  Diagnosis Date  . Asthma   . Hypertension     Social History   Tobacco Use  . Smoking status: Current Every Day Smoker    Packs/day: 0.50    Types: Cigarettes  . Smokeless tobacco: Never Used  Substance Use Topics  . Alcohol use: Yes    Comment: socially  . Drug use: Never   History reviewed. No pertinent family history. Not on File  OBJECTIVE: Blood pressure (!) 153/104, pulse 96,  temperature 99.6 F (37.6 C), temperature source Oral, resp. rate 15, height 5\' 9"  (1.753 m), weight 126.3 kg, SpO2 98 %.  Physical Exam  Constitutional: He is oriented to person, place, and time. He appears well-developed and well-nourished. He appears distressed (appears uncomfortable due to headache).  HENT:  Head: Normocephalic and atraumatic.  Mouth/Throat: Oropharyngeal exudate (yellowish-red sputum production) present.  No sinus tenderness  Eyes: Pupils are equal, round, and reactive to light. Conjunctivae and EOM are normal. No scleral icterus.  Neck: Normal range of motion. Neck supple.  No meningimus  Cardiovascular: Normal rate, regular rhythm, normal heart sounds and intact distal pulses.  No murmur heard. Pulmonary/Chest: Effort normal and breath sounds normal. No respiratory distress. He has no wheezes. He has no rales.  Decreased left lower lobe breath sounds  Abdominal: Soft. Bowel sounds are normal. There is no tenderness. There is no guarding.  Lymphadenopathy:    He has no cervical adenopathy.  Neurological: He is alert and oriented to person, place, and time. No cranial nerve deficit or sensory deficit.  Skin: Skin is warm and dry.  Psychiatric: He has a normal mood and affect. His behavior is normal. Judgment and thought content normal.    Lab Results Lab Results  Component Value Date   WBC 15.2 (H) 11/20/2017   HGB 14.7 11/20/2017   HCT 45.0 11/20/2017   MCV 81.5 11/20/2017   PLT 191 11/20/2017    Lab Results  Component Value Date   CREATININE 1.19 11/20/2017   BUN 10 11/20/2017   NA 131 (L) 11/20/2017   K 4.0 11/20/2017   CL 102 11/20/2017   CO2 20 (L) 11/20/2017    Lab Results  Component Value Date   ALT 31 11/18/2017   AST 26 11/18/2017   ALKPHOS 94 11/18/2017   BILITOT 0.9 11/18/2017     Microbiology: Recent Results (from the past 240 hour(s))  Culture, blood (Routine x 2)     Status: None (Preliminary result)   Collection Time: 11/18/17   1:39 PM  Result Value Ref Range Status   Specimen Description BLOOD LEFT ANTECUBITAL  Final   Special Requests   Final    BOTTLES DRAWN AEROBIC AND ANAEROBIC Blood Culture adequate volume   Culture   Final    NO GROWTH 2 DAYS Performed at [facility], [address], [place], [place] [address]    Report Status PENDING  Incomplete  Group A Strep by PCR     Status: None   Collection Time: 11/18/17  1:50 PM  Result Value Ref Range Status   Group A Strep by PCR NOT DETECTED NOT DETECTED Final    Comment: Performed at [facility], [address] [place], [place] [address]  Culture, blood (Routine x 2)     Status: None (Preliminary result)   Collection Time: 11/18/17  1:55 PM  Result Value Ref Range Status   Specimen Description BLOOD RIGHT ANTECUBITAL  Final   Special Requests   Final  BOTTLES DRAWN AEROBIC AND ANAEROBIC Blood Culture adequate volume   Culture   Final    NO GROWTH 2 DAYS Performed at [facility], [address], [place], [place] [address]    Report Status PENDING  Incomplete  Urine culture     Status: None   Collection Time: 11/18/17  5:15 PM  Result Value Ref Range Status   Specimen Description URINE, RANDOM  Final   Special Requests NONE  Final   Culture   Final    NO GROWTH Performed at [facility], [address] [place], [place] [address]    Report Status 11/20/2017 FINAL  Final  Respiratory Panel by PCR     Status: None   Collection Time: 11/19/17 12:14 AM  Result Value Ref Range Status   Adenovirus NOT DETECTED NOT DETECTED Final   Coronavirus 229E NOT DETECTED NOT DETECTED Final   Coronavirus HKU1 NOT DETECTED NOT DETECTED Final   Coronavirus NL63 NOT DETECTED NOT DETECTED Final   Coronavirus OC43 NOT DETECTED NOT DETECTED Final   Metapneumovirus NOT DETECTED NOT DETECTED Final   Rhinovirus / Enterovirus NOT DETECTED NOT DETECTED Final   Influenza A NOT DETECTED NOT DETECTED Final   Influenza B NOT DETECTED NOT  DETECTED Final   Parainfluenza Virus 1 NOT DETECTED NOT DETECTED Final   Parainfluenza Virus 2 NOT DETECTED NOT DETECTED Final   Parainfluenza Virus 3 NOT DETECTED NOT DETECTED Final   Parainfluenza Virus 4 NOT DETECTED NOT DETECTED Final   Respiratory Syncytial Virus NOT DETECTED NOT DETECTED Final   Bordetella pertussis NOT DETECTED NOT DETECTED Final   Chlamydophila pneumoniae NOT DETECTED NOT DETECTED Final   Mycoplasma pneumoniae NOT DETECTED NOT DETECTED Final    Comment: Performed at [facility], [address], [place], [place] [address]    Theotis Barrio, MD, PGY1 11/20/2017, 11:06 AM

## 2017-11-20 NOTE — Progress Notes (Signed)
Family Medicine Teaching Service Daily Progress Note Intern Pager: [phone]  Patient name: Neamiah Sciarra Medical record number: [id number] Date of birth: October 16, 1985 Age: 32 y.o. Gender: male  Primary Care Provider: Patient, No Pcp Per Consultants: none Code Status: FULL  Pt Overview and Major Events to Date:  11/9 Patient admitted  Assessment and Plan:  Ry Moody is a 31 y.o. male presenting with fever and headache for 3 days. PMH is significant for HTN, asthma.   Sepsis likely 2/2 PNA: Temperatures have improved to 103 max overnight, 101 this morning and one reading of 99.6. He is on the cooling blanket and ice packs with no covers. WBC today improved slightly to 15.2 RVP is negative. Strep pneumoniae and HIV negative, legionella pending. Sputum culture pending. cxray showed left mid and lower lung consolidation concerning for pneumonia- will treat as source and hold off on getting LP at this time. Lung sounds clear bilaterally on exam. Patient is lethargic and slow to answer questions but does have appropriate answers and is oriented. He denies nausea but has no appetite. He had diarrhea yesterday. - continue cefepime and vancomycin, doxycycline - f/u blood/urine/sputum cultures, HIV screen, legionella antigens - monitor fever curve - droplet precautions - strict I/O - consult ID on 11/11  AKI  Proteinuria: Cr 1.44 on admission with unclear baseline and remained elevated to 1.46 after 3L fluid boluses and 1.5 x maintenance fluid rate. UA with protein >300 on admission.  Urine microscopy with only 0-5 RBCs, making nephritic process unlikely.  Nephrotic process less likely since albumin is >3.  Proteinuria can often occur with fever in young adults and children.  Urine protein/creatinine ratio results below reportable range. Urine output 400cc recorded yesterday- 300cc already recorded for today. BMP pending -Trend BMP / urinary output - maintenance fluids NS 155mL/hr - Replace  electrolytes as indicated - Avoid nephrotoxic agents, ensure adequate renal perfusion  HTN: BP 148/98 this morning - Monitor  - Prn hydralazine for sys >180, sys >110.  - Continue amlodipine 10 mg daily  Asthma: Has albuterol inhaler at home. Last used a couple months ago.  - duonebs TID  Alcohol Use: Patient reports drinking socially 1 beer and 2 shots, 1-2 x per week only. CIWA scores have been zeroes.  - CIWA with no benzo to monitor for any signs of alcohol withdrawal  FEN/GI: Lovenox PPx: Heart healthy due to high BP  Disposition: continue on telemetry  Subjective:  Overnight: no acute events. Patient remained febrile Tmax 103.1  Today: patient seems more lethargic. He does not appear warm. He does not feel like he can eat but denies nausea.   Objective: Temp:  [101.9 F (38.8 C)-104.9 F (40.5 C)] 101.9 F (38.8 C) (11/11 0553) Pulse Rate:  [85-100] 93 (11/11 0300) Resp:  [15-36] 16 (11/11 0300) BP: (151-182)/(88-106) 162/105 (11/11 0300) SpO2:  [90 %-96 %] 93 % (11/11 0300) Physical Exam: General: laying comfortably in bed- lethargic Cardiovascular: normal rhythm, tahcycardic, no murmurs, good capillary refill Respiratory: CTAB, no increased work of breathing,no cough during encounter Abdomen: nontender, non-distended, very active bowel sounds diffusely Extremities: no edema, good perfusion Neurologic: alert and oriented x4 Derm: no rashes or lesions noted. Multiple healed scars diffusely  Laboratory: Recent Labs  Lab 11/18/17 1350 11/19/17 0313  WBC 15.6* 16.0*  HGB 15.2 14.5  HCT 48.2 44.4  PLT 259 209   Recent Labs  Lab 11/18/17 1350 11/19/17 0313  NA 131* 136  K 4.5 4.3  CL 99 105  CO2 22 22  BUN 13 11  CREATININE 1.44* 1.46*  CALCIUM 8.9 8.3*  PROT 7.6  --   BILITOT 0.9  --   ALKPHOS 94  --   ALT 31  --   AST 26  --   GLUCOSE 132* 124*    Imaging/Diagnostic Tests: Dg Chest 2 View  Result Date: 11/18/2017 CLINICAL DATA:  Fever,  productive cough.  Shortness of breath. EXAM: CHEST - 2 VIEW COMPARISON:  Radiographs of January 27, 2014. FINDINGS: The heart size and mediastinal contours are within normal limits. Both lungs are clear. No pneumothorax or pleural effusion is noted. The visualized skeletal structures are unremarkable. IMPRESSION: No active cardiopulmonary disease. Electronically Signed   By: Lupita Raider, M.D.   On: 11/18/2017 15:11   Dg Chest Port 1 View  Result Date: 11/19/2017 CLINICAL DATA:  Hypoxia EXAM: PORTABLE CHEST 1 VIEW COMPARISON:  Chest radiograph 11/18/2017 FINDINGS: Monitoring leads overlie the patient. Stable cardiac and mediastinal contours. Interval development of consolidation within the left mid and lower lung. No pleural effusion or pneumothorax. IMPRESSION: Left mid and lower lung consolidation concerning for pneumonia in the appropriate clinical setting. Electronically Signed   By: Annia Belt M.D.   On: 11/19/2017 15:18     Leeroy Bock, DO 11/20/2017, 6:59 AM PGY-1, [facility] FPTS Intern pager: [phone], text pages welcome

## 2017-11-21 LAB — BASIC METABOLIC PANEL
Anion gap: 9 (ref 5–15)
BUN: 10 mg/dL (ref 6–20)
CHLORIDE: 103 mmol/L (ref 98–111)
CO2: 23 mmol/L (ref 22–32)
Calcium: 8.6 mg/dL — ABNORMAL LOW (ref 8.9–10.3)
Creatinine, Ser: 1.22 mg/dL (ref 0.61–1.24)
GFR calc Af Amer: >60 mL/min (ref >60)
GFR calc non Af Amer: >60 mL/min (ref >60)
GLUCOSE: 136 mg/dL — AB (ref 70–99)
Potassium: 4 mmol/L (ref 3.5–5.1)
Sodium: 135 mmol/L (ref 135–145)

## 2017-11-21 LAB — EXPECTORATED SPUTUM ASSESSMENT W GRAM STAIN, RFLX TO RESP C

## 2017-11-21 LAB — CBC
HEMATOCRIT: 43.5 % (ref 39.0–52.0)
HEMOGLOBIN: 14.2 g/dL (ref 13.0–17.0)
MCH: 26.9 pg (ref 26.0–34.0)
MCHC: 32.6 g/dL (ref 30.0–36.0)
MCV: 82.5 fL (ref 80.0–100.0)
Platelets: 200 10*3/uL (ref 150–400)
RBC: 5.27 MIL/uL (ref 4.22–5.81)
RDW: 13.6 % (ref 11.5–15.5)
WBC: 17.1 10*3/uL — ABNORMAL HIGH (ref 4.0–10.5)
nRBC: 0 % (ref 0.0–0.2)

## 2017-11-21 LAB — EXPECTORATED SPUTUM ASSESSMENT W REFEX TO RESP CULTURE: SPECIAL REQUESTS: NORMAL

## 2017-11-21 MED ORDER — IPRATROPIUM-ALBUTEROL 0.5-2.5 (3) MG/3ML IN SOLN
3.0000 mL | Freq: Three times a day (TID) | RESPIRATORY_TRACT | Status: DC
  Administered 2017-11-21 – 2017-11-22 (×3): 3 mL via RESPIRATORY_TRACT
  Filled 2017-11-21 (×4): qty 3

## 2017-11-21 MED ORDER — INDAPAMIDE 1.25 MG PO TABS
1.2500 mg | ORAL_TABLET | Freq: Every day | ORAL | Status: DC
  Administered 2017-11-21 – 2017-11-22 (×3): 1.25 mg via ORAL
  Filled 2017-11-21 (×2): qty 1

## 2017-11-21 MED ORDER — OXYCODONE HCL 5 MG PO TABS
5.0000 mg | ORAL_TABLET | Freq: Four times a day (QID) | ORAL | Status: DC | PRN
  Administered 2017-11-21 – 2017-11-22 (×2): 5 mg via ORAL
  Filled 2017-11-21 (×2): qty 1

## 2017-11-21 MED ORDER — IPRATROPIUM-ALBUTEROL 0.5-2.5 (3) MG/3ML IN SOLN
3.0000 mL | Freq: Two times a day (BID) | RESPIRATORY_TRACT | Status: DC
  Administered 2017-11-21: 3 mL via RESPIRATORY_TRACT
  Filled 2017-11-21: qty 3

## 2017-11-21 NOTE — Progress Notes (Signed)
Family Medicine Teaching Service Daily Progress Note Intern Pager: [phone]  Patient name: Anthony Dalton Medical record number: [id number] Date of birth: 23-Feb-1985 Age: 32 y.o. Gender: male  Primary Care Provider: Patient, No Pcp Per Consultants: none Code Status: FULL  Pt Overview and Major Events to Date:  11/9 Patient admitted  Assessment and Plan:  Anthony Dalton is a [age] y.o. male presenting with fever and headache for 3 days. PMH is significant for HTN, asthma.   Sepsis likely 2/2 viral URI vs. PNA: Temperatures have improved to Tmax 101.4 overnight. Latest temperature is 97.1 and is off the cooling blanket. He looks much better in person, more alert. He is ORA with no dyspnea. Lungs sounds clear on exam. Patient coughed up bloody mucous this morning about the size of 2 silver dollars. He again denies any foreign travel or exposure to high risk TB environments/contacts. Would consider TB testing, although low suspicion. He is still not wanting to eat any food saying that everything tastes bad- denies nausea. WBC worsened to 17.1 today. Will continue to monitor until afebrile >24 hours. ID was consulted and recommended change IV doxy to PO 100mg  BID for total of 2 more days (end date 11/13), supportive therapy (hydration, tylenol, antihistamines), and states most likely viral bronchitis/sinutitis. Legionella negative.  - continue PO doxycycline - monitor fever curve - strict I/O - f/u ID recs  AKI  Proteinuria: Cr 1.22 today. 3200cc urine output yesterday s/p IV hydration. Patient is off IV fluids at this time.  -Trend BMP / urinary output - encourage PO intake - Avoid nephrotoxic agents, ensure adequate renal perfusion  HTN: BP 168/102 this morning. Amlodipine started 11/10. Will give another day of observation before making adjustments to dose. - Monitor  - Prn hydralazine for sys >180, sys >110.  - Continue amlodipine 10 mg daily  Asthma: Has albuterol inhaler at home.  Last used a couple months ago. Patient was placed on oxygen overnight. No documentation to support. Patient denies any dyspnea. ORA with sat >95% this morning. - duonebs TID  Alcohol Use: Patient reports drinking socially 1 beer and 2 shots, 1-2 x per week only. CIWA scores have been zeroes.  - CIWA with no benzo to monitor for any signs of alcohol withdrawal  FEN/GI: Lovenox PPx: Heart healthy  Disposition: med surg  Subjective:  Overnight: no acute events. Tmax 101.4, patient was placed on oxygen but he does not know why- no desats recorded and no notes. Denies difficulty breathing. He is on room air this morning.  Today: patient seems much improved- sitting up in bed. Denies nausea but no appetite because "everything tastes bad" despite family member bringing in outside food. He coughed up large bloody mucus this morning. States he has some pain with coughing. He wants to get up and walk around more today.   Objective: Temp:  [99.6 F (37.6 C)-103.9 F (39.9 C)] 100.2 F (37.9 C) (11/12 0627) Pulse Rate:  [91-116] 116 (11/12 0300) Resp:  [15-26] 20 (11/12 0300) BP: (138-156)/(75-105) 156/93 (11/12 0627) SpO2:  [93 %-98 %] 94 % (11/12 0300)   Physical Exam: General: sitting up comfortably in bed Cardiovascular: normal rhythm, tahcycardic, no murmurs, good capillary refill Respiratory: CTAB, no increased work of breathing,no cough during encounter Abdomen: nontender, non-distended, very active bowel sounds diffusely Extremities: no edema, good perfusion Neurologic: alert and oriented x4 Derm: no rashes or lesions noted. Multiple healed scars diffusely  Laboratory: Recent Labs  Lab 11/19/17 0313 11/20/17 [id number] 11/21/17 [id number]  WBC 16.0* 15.2* 17.1*  HGB 14.5 14.7 14.2  HCT 44.4 45.0 43.5  PLT 209 191 200   Recent Labs  Lab 11/18/17 1350 11/19/17 0313 11/20/17 0808 11/21/17 0314  NA 131* 136 131* 135  K 4.5 4.3 4.0 4.0  CL 99 105 102 103  CO2 22 22 20* 23  BUN 13  11 10 10   CREATININE 1.44* 1.46* 1.19 1.22  CALCIUM 8.9 8.3* 8.4* 8.6*  PROT 7.6  --   --   --   BILITOT 0.9  --   --   --   ALKPHOS 94  --   --   --   ALT 31  --   --   --   AST 26  --   --   --   GLUCOSE 132* 124* 145* 136*    Imaging/Diagnostic Tests: Dg Chest 2 View  Result Date: 11/18/2017 CLINICAL DATA:  Fever, productive cough.  Shortness of breath. EXAM: CHEST - 2 VIEW COMPARISON:  Radiographs of January 27, 2014. FINDINGS: The heart size and mediastinal contours are within normal limits. Both lungs are clear. No pneumothorax or pleural effusion is noted. The visualized skeletal structures are unremarkable. IMPRESSION: No active cardiopulmonary disease. Electronically Signed   By: Lupita Raider, M.D.   On: 11/18/2017 15:11   Dg Chest Port 1 View  Result Date: 11/19/2017 CLINICAL DATA:  Hypoxia EXAM: PORTABLE CHEST 1 VIEW COMPARISON:  Chest radiograph 11/18/2017 FINDINGS: Monitoring leads overlie the patient. Stable cardiac and mediastinal contours. Interval development of consolidation within the left mid and lower lung. No pleural effusion or pneumothorax. IMPRESSION: Left mid and lower lung consolidation concerning for pneumonia in the appropriate clinical setting. Electronically Signed   By: Annia Belt M.D.   On: 11/19/2017 15:18     Leeroy Bock, DO 11/21/2017, 7:25 AM PGY-1, [facility] FPTS Intern pager: [phone], text pages welcome

## 2017-11-21 NOTE — Care Management Note (Addendum)
Case Management Note  Patient Details  Name: Anthony Dalton MRN: [id number] Date of Birth: 10-30-1985  Subjective/Objective:    From home with girlfriend, pta indep, presents with cap, has no pcp , no insurance. [facility]  [facility].  NCM gave patient the brochures for [facility] and [facility].                Action/Plan: NCM will follow for transition of care needs.   Expected Discharge Date:                  Expected Discharge Plan:  Home/Self Care  In-House Referral:     Discharge planning Services  CM Consult, Follow-up appt scheduled, Medication Assistance  Post Acute Care Choice:    Choice offered to:     DME Arranged:    DME Agency:     HH Arranged:    HH Agency:     Status of Service:  In process, will continue to follow  If discussed at Long Length of Stay Meetings, dates discussed:    Additional Comments:  Leone Havenaylor, Takeela Peil Clinton, RN 11/21/2017, 4:27 PM

## 2017-11-22 LAB — CBC
HEMATOCRIT: 43.8 % (ref 39.0–52.0)
Hemoglobin: 14 g/dL (ref 13.0–17.0)
MCH: 26.1 pg (ref 26.0–34.0)
MCHC: 32 g/dL (ref 30.0–36.0)
MCV: 81.7 fL (ref 80.0–100.0)
Platelets: 291 10*3/uL (ref 150–400)
RBC: 5.36 MIL/uL (ref 4.22–5.81)
RDW: 13.6 % (ref 11.5–15.5)
WBC: 15.4 10*3/uL — ABNORMAL HIGH (ref 4.0–10.5)
nRBC: 0 % (ref 0.0–0.2)

## 2017-11-22 LAB — BASIC METABOLIC PANEL
Anion gap: 7 (ref 5–15)
BUN: 14 mg/dL (ref 6–20)
CALCIUM: 8.7 mg/dL — AB (ref 8.9–10.3)
CHLORIDE: 105 mmol/L (ref 98–111)
CO2: 25 mmol/L (ref 22–32)
CREATININE: 1.21 mg/dL (ref 0.61–1.24)
GFR calc Af Amer: >60 mL/min (ref >60)
GFR calc non Af Amer: >60 mL/min (ref >60)
GLUCOSE: 131 mg/dL — AB (ref 70–99)
Potassium: 3.3 mmol/L — ABNORMAL LOW (ref 3.5–5.1)
Sodium: 137 mmol/L (ref 135–145)

## 2017-11-22 MED ORDER — INDAPAMIDE 2.5 MG PO TABS: 2.5000 mg | ORAL_TABLET | Freq: Every day | ORAL | Status: DC

## 2017-11-22 MED ORDER — INDAPAMIDE 2.5 MG PO TABS: 2.5000 mg | ORAL_TABLET | Freq: Every day | ORAL | 0 refills | Status: AC

## 2017-11-22 MED ORDER — ENOXAPARIN SODIUM 60 MG/0.6ML ~~LOC~~ SOLN: 60.0000 mg | SUBCUTANEOUS | Status: DC

## 2017-11-22 MED ORDER — DOXYCYCLINE HYCLATE 100 MG PO TABS: 100.0000 mg | ORAL_TABLET | Freq: Two times a day (BID) | ORAL | 0 refills | Status: AC

## 2017-11-22 MED ORDER — ALBUTEROL SULFATE HFA 108 (90 BASE) MCG/ACT IN AERS: 2.0000 | INHALATION_SPRAY | Freq: Four times a day (QID) | RESPIRATORY_TRACT | 2 refills | Status: AC | PRN

## 2017-11-22 MED ORDER — POTASSIUM CHLORIDE CRYS ER 20 MEQ PO TBCR
40.0000 meq | EXTENDED_RELEASE_TABLET | Freq: Once | ORAL | Status: AC
  Administered 2017-11-22: 40 meq via ORAL
  Filled 2017-11-22: qty 2

## 2017-11-22 MED ORDER — AMLODIPINE BESYLATE 10 MG PO TABS: 10.0000 mg | ORAL_TABLET | Freq: Every day | ORAL | 0 refills | Status: AC

## 2017-11-22 MED FILL — AMLODIPINE BESYLATE 10 MG T: 10 | 30 days supply | Qty: 30 | Fill #0

## 2017-11-22 MED FILL — DOXYCYCLINE HYCLATE 100 MG: 100 | 1 days supply | Qty: 1 | Fill #0

## 2017-11-22 MED FILL — ?INDAPAMIDE 2.5MG TABLET: 2.5 | 30 days supply | Qty: 30 | Fill #0

## 2017-11-22 MED FILL — !VENTOLIN HFA INHALER: 108 (90 BAS | 25 days supply | Qty: 18 | Fill #0

## 2017-11-22 NOTE — Discharge Instructions (Signed)
Fever, Adult A fever is an increase in the body's temperature. It is often defined as a temperature of 100 F (38C) or higher. Short mild or moderate fevers often have no long-term effects. They also often do not need treatment. Moderate or high fevers may make you feel uncomfortable. Sometimes, they can also be a sign of a serious illness or disease. The sweating that may happen with repeated fevers or fevers that last a while may also cause you to not have enough fluid in your body (dehydration). You can take your temperature with a thermometer to see if you have a fever. A measured temperature can change with:  Age.  Time of day.  Where the thermometer is placed: ? Mouth (oral). ? Rectum (rectal). ? Ear (tympanic). ? Underarm (axillary). ? Forehead (temporal).  Follow these instructions at home: Pay attention to any changes in your symptoms. Take these actions to help with your condition:  Take over-the-counter and prescription medicines only as told by your doctor. Follow the dosing instructions carefully.  If you were prescribed an antibiotic medicine, take it as told by your doctor. Do not stop taking the antibiotic even if you start to feel better.  Rest as needed.  Drink enough fluid to keep your pee (urine) clear or pale yellow.  Sponge yourself or bathe with room-temperature water as needed. This helps to lower your body temperature . Do not use ice water.  Do not wear too many blankets or heavy clothes.  Contact a doctor if:  You throw up (vomit).  You cannot eat or drink without throwing up.  You have watery poop (diarrhea).  It hurts when you pee.  Your symptoms do not get better with treatment.  You have new symptoms.  You feel very weak. Get help right away if:  You are short of breath or have trouble breathing.  You are dizzy or you pass out (faint).  You feel confused.  You have signs of not having enough fluid in your body, such as: ? A dry  mouth. ? Peeing less. ? Looking pale.  You have very bad pain in your belly (abdomen).  You keep throwing up or having water poop.  You have a skin rash.  Your symptoms suddenly get worse. This information is not intended to replace advice given to you by your health care provider. Make sure you discuss any questions you have with your health care provider. Document Released: 10/06/2007 Document Revised: 06/04/2015 Document Reviewed: 02/20/2014 Elsevier Interactive Patient Education  2018 [employer]  

## 2017-11-23 LAB — CULTURE, BLOOD (ROUTINE X 2)
CULTURE: NO GROWTH
Culture: NO GROWTH
SPECIAL REQUESTS: ADEQUATE
Special Requests: ADEQUATE

## 2017-11-23 LAB — CULTURE, RESPIRATORY
CULTURE: NORMAL
SPECIAL REQUESTS: NORMAL

## 2017-11-23 LAB — CULTURE, RESPIRATORY W GRAM STAIN

## 2017-11-30 ENCOUNTER — Ambulatory Visit: Payer: Self-pay | Admitting: Family Medicine

## 2017-12-11 ENCOUNTER — Ambulatory Visit: Payer: Self-pay | Admitting: Family Medicine

## 2018-11-11 DEATH — deceased

## 2019-10-04 IMAGING — DX DG CHEST 2V
2 series · 2 of 2 positions shown · non-contrast
Comparison: Radiographs January 27, 2014.

CLINICAL DATA: Fever, productive cough.  Shortness of breath.

EXAM:
CHEST - 2 VIEW

[w chest pa]
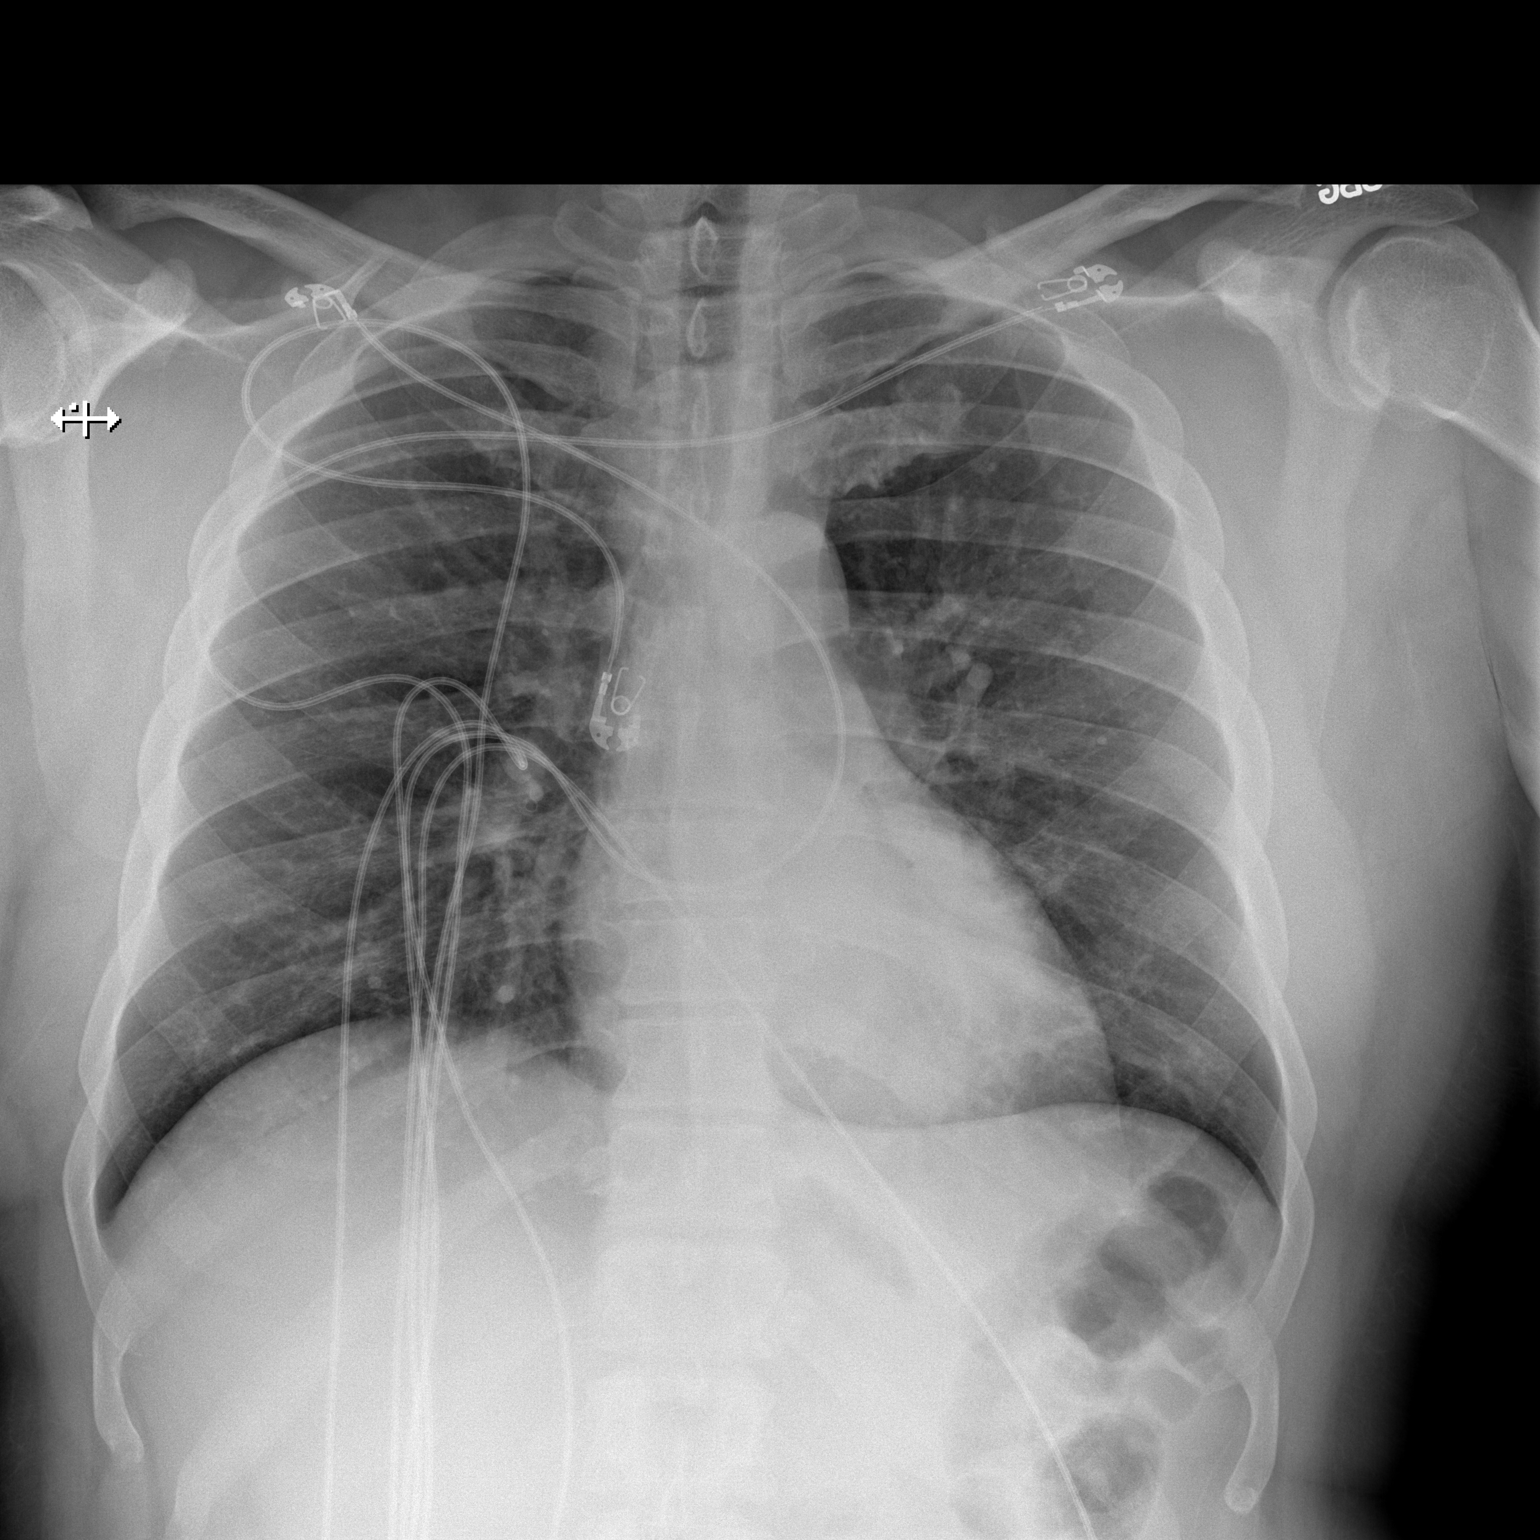

[w chest lat]
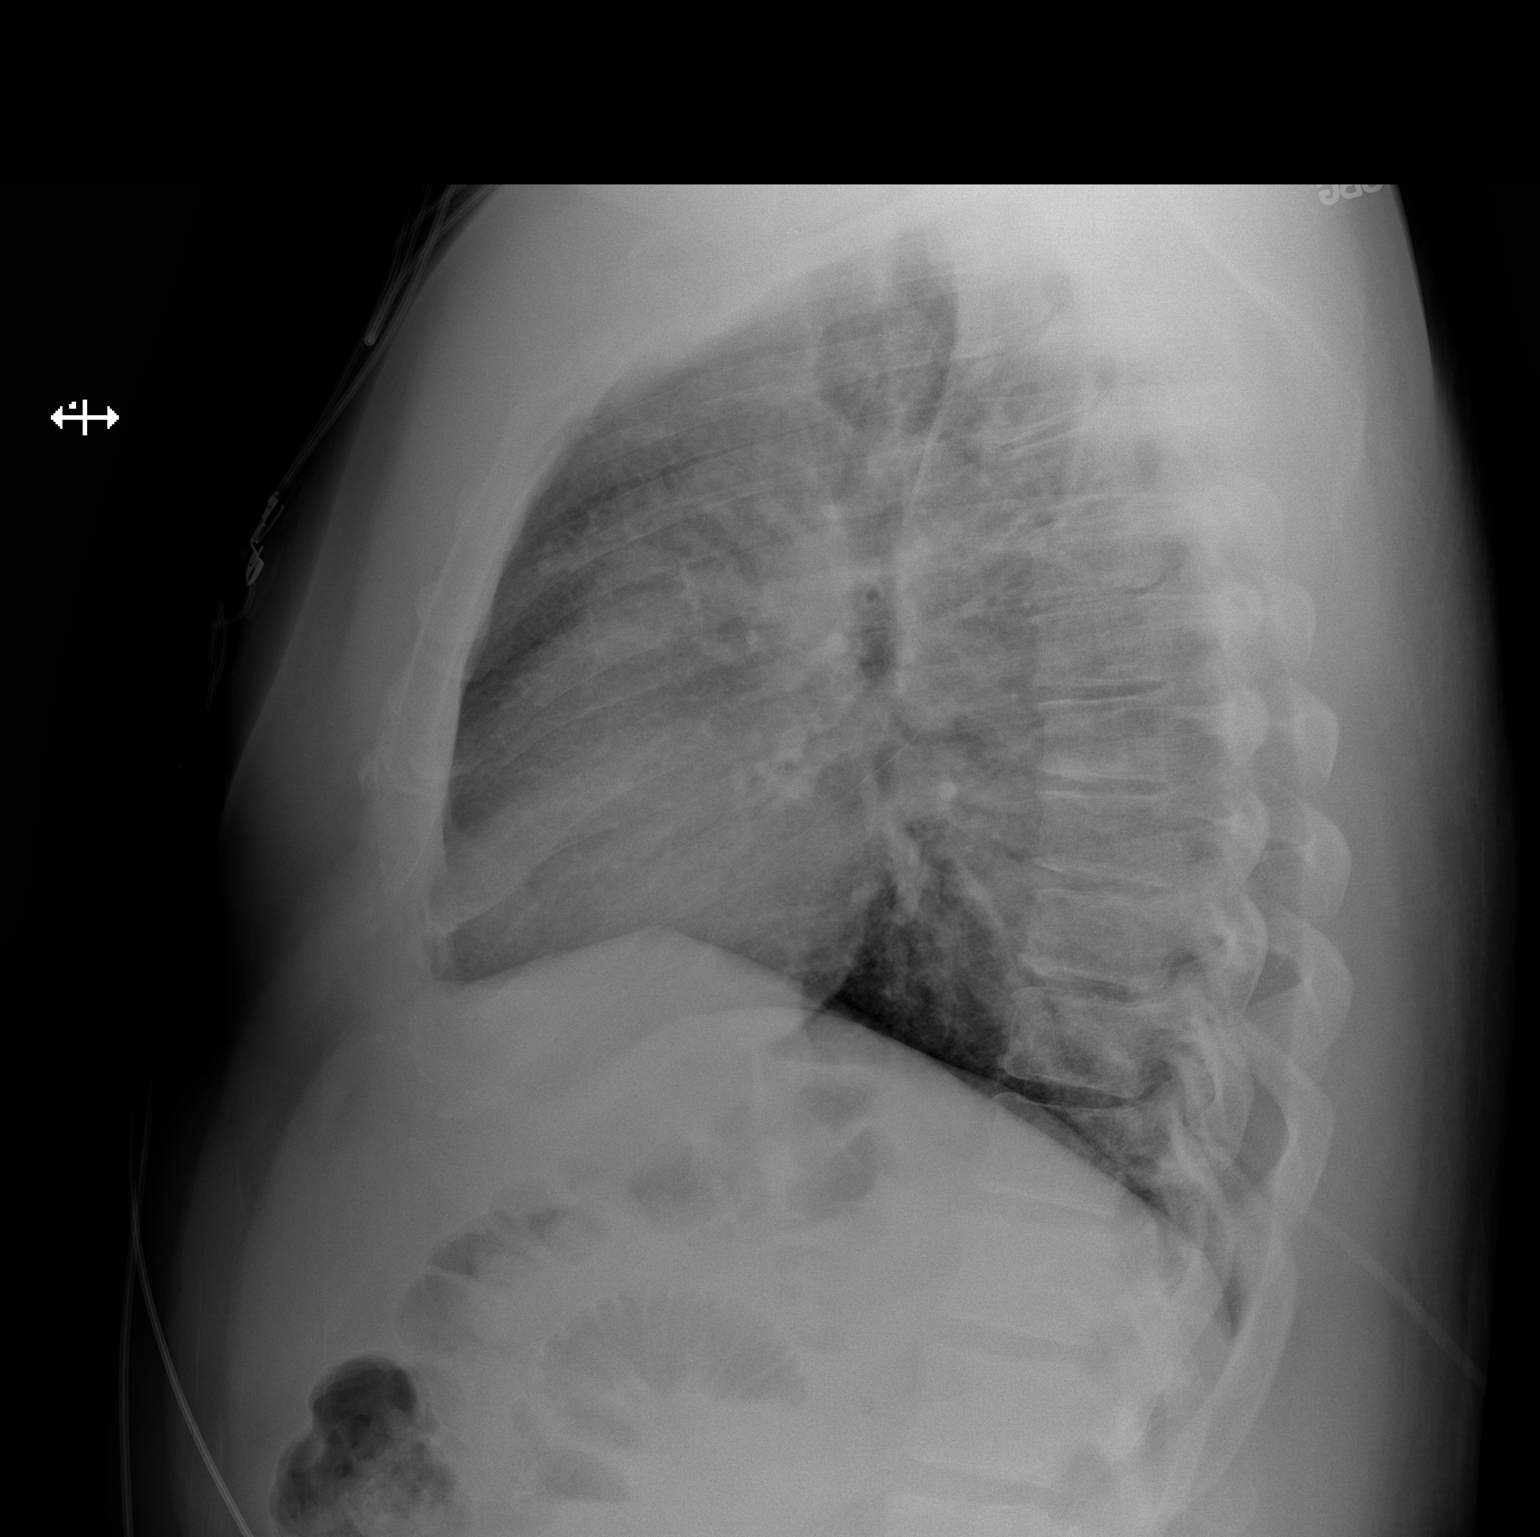

[2 of 2 positions shown; findings below may reference images not displayed]

FINDINGS: The heart size and mediastinal contours are within normal limits.
Both lungs are clear. No pneumothorax or pleural effusion is noted.
The visualized skeletal structures are unremarkable.
IMPRESSION: No active cardiopulmonary disease.
